# Patient Record
Sex: Female | Born: 2000 | Race: White | Hispanic: No | Marital: Single | Smoking: Never smoker
Health system: Southern US, Community
[De-identification: ages and names within clinical notes are randomized; demographics above are authoritative.]

---
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
## 2019-10-08 DIAGNOSIS — E669 Obesity, unspecified: Secondary | ICD-10-CM | POA: Insufficient documentation

## 2019-10-08 DIAGNOSIS — R519 Headache, unspecified: Secondary | ICD-10-CM | POA: Insufficient documentation

## 2019-10-08 DIAGNOSIS — G8929 Other chronic pain: Secondary | ICD-10-CM | POA: Insufficient documentation

## 2021-08-16 ENCOUNTER — Ambulatory Visit (HOSPITAL_COMMUNITY)
Admission: EM | Admit: 2021-08-16 | Discharge: 2021-08-16 | Disposition: A | Discharge status: Home / Self Care | Payer: BC Managed Care – PPO | Attending: Physician Assistant | Admitting: Physician Assistant

## 2021-08-16 ENCOUNTER — Ambulatory Visit (INDEPENDENT_AMBULATORY_CARE_PROVIDER_SITE_OTHER): Payer: BC Managed Care – PPO

## 2021-08-16 ENCOUNTER — Other Ambulatory Visit: Payer: Self-pay

## 2021-08-16 ENCOUNTER — Encounter (HOSPITAL_COMMUNITY): Payer: Self-pay

## 2021-08-16 DIAGNOSIS — S82391A Other fracture of lower end of right tibia, initial encounter for closed fracture: Secondary | ICD-10-CM

## 2021-08-16 DIAGNOSIS — M25572 Pain in left ankle and joints of left foot: Secondary | ICD-10-CM | POA: Diagnosis not present

## 2021-08-16 DIAGNOSIS — W19XXXA Unspecified fall, initial encounter: Secondary | ICD-10-CM

## 2021-08-16 DIAGNOSIS — M25571 Pain in right ankle and joints of right foot: Secondary | ICD-10-CM

## 2021-08-16 NOTE — ED Notes (Signed)
Ortho called for short leg splint.

## 2021-08-16 NOTE — ED Provider Notes (Signed)
[place]    CSN: [id number] Arrival date & time: 08/16/21  1605      History   Chief Complaint Chief Complaint  Patient presents with   Ankle Pain    HPI Jessica Phillips is a 20 y.o. female.   Patient presents today with a several hour history of bilateral ankle pain following injury at school.  Patient reports that she was stepping down off of the bottom step onto uneven ground when she lost her footing and her left ankle inverted and her right ankle everted.  She has had pain since that time which is rated 7.5 on a 0-10 pain scale, described as aching, worse with attempted ambulation, described as aching with shooting pains during palpation or ambulation, no alleviating factors identified.  She has not tried any over-the-counter medication for symptom management.  She denies history of previous ankle injury or surgery.  She does report some pain in her ankles while she is at work and standing on her feet for long periods of time but has not been evaluated for this.  She denies any numbness or paresthesias in her toes.  She is able to bear weight but it is uncomfortable.  She is confident she is not pregnant.   History reviewed. No pertinent past medical history.  There are no problems to display for this patient.   History reviewed. No pertinent surgical history.  OB History   No obstetric history on file.      Home Medications    Prior to Admission medications   Not on File    Family History History reviewed. No pertinent family history.  Social History Social History   Tobacco Use   Smoking status: Never   Smokeless tobacco: Never  Vaping Use   Vaping Use: Never used  Substance Use Topics   Alcohol use: Yes   Drug use: Never     Allergies   Patient has no known allergies.   Review of Systems Review of Systems  Constitutional:  Positive for activity change. Negative for appetite change, fatigue and fever.  Respiratory:  Negative for cough  and shortness of breath.   Cardiovascular:  Negative for chest pain.  Gastrointestinal:  Negative for abdominal pain, diarrhea, nausea and vomiting.  Musculoskeletal:  Positive for arthralgias. Negative for myalgias.  Neurological:  Negative for dizziness, light-headedness and headaches.    Physical Exam Triage Vital Signs ED Triage Vitals  Enc Vitals Group     BP 08/16/21 1700 137/75     Pulse Rate 08/16/21 1700 90     Resp 08/16/21 1700 18     Temp 08/16/21 1700 98.5 F (36.9 C)     Temp Source 08/16/21 1700 Oral     SpO2 08/16/21 1700 99 %     Weight --      Height --      Head Circumference --      Peak Flow --      Pain Score 08/16/21 1658 7     Pain Loc --      Pain Edu? --      Excl. in [place]? --    No data found.  Updated Vital Signs BP 137/75 (BP Location: Right Arm)   Pulse 90   Temp 98.5 F (36.9 C) (Oral)   Resp 18   LMP 08/08/2021 (Approximate)   SpO2 99%   Visual Acuity Right Eye Distance:   Left Eye Distance:   Bilateral Distance:    Right Eye Near:  Left Eye Near:    Bilateral Near:     Physical Exam Vitals reviewed.  Constitutional:      General: She is awake. She is not in acute distress.    Appearance: Normal appearance. She is normal weight. She is not ill-appearing.     Comments: Very pleasant female present age in no acute distress sitting comfortably in exam room  HENT:     Head: Normocephalic and atraumatic.  Cardiovascular:     Rate and Rhythm: Normal rate and regular rhythm.     Heart sounds: Normal heart sounds, S1 normal and S2 normal. No murmur heard. Pulmonary:     Effort: Pulmonary effort is normal.     Breath sounds: Normal breath sounds. No wheezing, rhonchi or rales.     Comments: Clear to auscultation bilaterally Abdominal:     General: Bowel sounds are normal.     Palpations: Abdomen is soft.     Tenderness: There is no abdominal tenderness. There is no right CVA tenderness, left CVA tenderness, guarding or rebound.   Musculoskeletal:     Right ankle: No swelling. Tenderness present over the lateral malleolus. Decreased range of motion. Anterior drawer test negative.     Left ankle: No swelling. Tenderness present over the medial malleolus. Decreased range of motion. Anterior drawer test negative.  Psychiatric:        Behavior: Behavior is cooperative.     UC Treatments / Results  Labs (all labs ordered are listed, but only abnormal results are displayed) Labs Reviewed - No data to display  EKG   Radiology DG Ankle Complete Left  Result Date: 08/16/2021 CLINICAL DATA:  Pain after fall EXAM: RIGHT ANKLE - COMPLETE 3+ VIEW; LEFT ANKLE COMPLETE - 3+ VIEW COMPARISON:  None. FINDINGS: Right ankle: Frontal, oblique, and lateral views are obtained. On the lateral view, there is a small lucency through the superior aspect of the posterior malleolus, felt to represent a vascular channel rather than fracture. Please correlate with site of patient's pain. No other acute bony abnormalities. Alignment is anatomic. Joint spaces are well preserved. Soft tissues are unremarkable. Left ankle: Frontal, oblique, and lateral views of the left ankle are obtained. No acute fracture, subluxation, or dislocation. Joint spaces are well preserved. Soft tissues are unremarkable. IMPRESSION: 1. Cortical lucency within the right posterior malleolus, felt to represent a vascular channel rather than fracture. Please correlate with physical exam findings. 2. Otherwise no acute bony abnormality within either ankle. Electronically Signed   By: Randa Ngo M.D.   On: 08/16/2021 17:40   DG Ankle Complete Right  Result Date: 08/16/2021 CLINICAL DATA:  Pain after fall EXAM: RIGHT ANKLE - COMPLETE 3+ VIEW; LEFT ANKLE COMPLETE - 3+ VIEW COMPARISON:  None. FINDINGS: Right ankle: Frontal, oblique, and lateral views are obtained. On the lateral view, there is a small lucency through the superior aspect of the posterior malleolus, felt to  represent a vascular channel rather than fracture. Please correlate with site of patient's pain. No other acute bony abnormalities. Alignment is anatomic. Joint spaces are well preserved. Soft tissues are unremarkable. Left ankle: Frontal, oblique, and lateral views of the left ankle are obtained. No acute fracture, subluxation, or dislocation. Joint spaces are well preserved. Soft tissues are unremarkable. IMPRESSION: 1. Cortical lucency within the right posterior malleolus, felt to represent a vascular channel rather than fracture. Please correlate with physical exam findings. 2. Otherwise no acute bony abnormality within either ankle. Electronically Signed   By: Randa Ngo  M.D.   On: 08/16/2021 17:40    Procedures Procedures (including critical care time)  Medications Ordered in UC Medications - No data to display  Initial Impression / Assessment and Plan / UC Course  I have reviewed the triage vital signs and the nursing notes.  Pertinent labs & imaging results that were available during my care of the patient were reviewed by me and considered in my medical decision making (see chart for details).      X-ray obtained showed possible posterior malleolus fracture thought to be vascular channel, however, patient is tender over this area so we will treat as fracture and have her follow-up with foot and ankle as soon as possible.  She was given contact information for local specialist and instructed to call them to schedule an appointment soon as possible.  She was placed in posterior splint and instructed to be nonweightbearing until seeing the specialist.  She was given crutches.  Encouraged to use conservative treatment measures including RICE protocol for additional symptom relief.  She can alternate over-the-counter medications for pain relief.  She was given school excuse note allowing her several days off while managing this injury.  Discussed alarm symptoms that warrant emergent  evaluation.  Strict return precautions given to which patient expressed understanding.  Final Clinical Impressions(s) / UC Diagnoses   Final diagnoses:  Acute right ankle pain  Acute left ankle pain  Closed fracture of posterior malleolus of right tibia, initial encounter     Discharge Instructions      Please continue using cam boot until you are seen by specialist.  Keep your foot elevated as much as possible and use ice for additional symptom relief.  You can alternate Tylenol and ibuprofen over-the-counter for symptom relief.  Please call and schedule an appointment with podiatry/ankle specialist as soon as possible to be evaluated.  If you have any worsening symptoms including increased pain, swelling, numbness or tingling in your feet, inability to bear weight you need to be seen immediately.     ED Prescriptions   None    PDMP not reviewed this encounter.   Terrilee Croak, PA-C 08/16/21 1807

## 2021-08-16 NOTE — Discharge Instructions (Signed)
Please continue using cam boot until you are seen by specialist.  Keep your foot elevated as much as possible and use ice for additional symptom relief.  You can alternate Tylenol and ibuprofen over-the-counter for symptom relief.  Please call and schedule an appointment with podiatry/ankle specialist as soon as possible to be evaluated.  If you have any worsening symptoms including increased pain, swelling, numbness or tingling in your feet, inability to bear weight you need to be seen immediately.

## 2021-08-16 NOTE — ED Triage Notes (Signed)
Pt in with bilateral ankle pain after she fell down stairs at school  States the right ankle hurts more than the left and it hurts to put pressure on it

## 2021-08-17 ENCOUNTER — Encounter: Payer: Self-pay | Admitting: Podiatry

## 2021-08-17 ENCOUNTER — Ambulatory Visit: Payer: BC Managed Care – PPO | Admitting: Podiatry

## 2021-08-17 DIAGNOSIS — S93402A Sprain of unspecified ligament of left ankle, initial encounter: Secondary | ICD-10-CM

## 2021-08-17 DIAGNOSIS — S82391A Other fracture of lower end of right tibia, initial encounter for closed fracture: Secondary | ICD-10-CM

## 2021-08-18 ENCOUNTER — Encounter: Payer: Self-pay | Admitting: Podiatry

## 2021-08-18 NOTE — Progress Notes (Signed)
Subjective:  Patient ID: Jessica Phillips, female    DOB: 2001/07/20,  MRN: [id number]  Chief Complaint  Patient presents with   Fracture    Right foot fracture     20 y.o. female presents with the above complaint.  Patient presents with complaint of right posterior tibial lip fracture and left ankle sprain.  Patient states that it happened yesterday on 08/16/2021.  She states that she fell while walking at the school and it hurt a lot.  She says the right side is worse than left side.  She went to urgent care who had x-rays done which showed the fracture.  She states that she is ambulating with crutches with nonweightbearing to the right side and a brace on the left side.  She denies any other acute complaint she has not seen any foot and ankle specialist prior to me.  She would like to discuss treatment options.   Review of Systems: Negative except as noted in the HPI. Denies N/V/F/Ch.  No past medical history on file.  Current Outpatient Medications:    norgestimate-ethinyl estradiol (ORTHO-CYCLEN) 0.25-35 MG-MCG tablet, 1 po Q day, Disp: , Rfl:    norgestimate-ethinyl estradiol (SPRINTEC 28) 0.25-35 MG-MCG tablet, Take 1 tablet by mouth daily., Disp: , Rfl:    meclizine (ANTIVERT) 25 MG tablet, Take 25 mg by mouth 3 (three) times daily as needed., Disp: , Rfl:   Social History   Tobacco Use  Smoking Status Never  Smokeless Tobacco Never    No Known Allergies Objective:  There were no vitals filed for this visit. There is no height or weight on file to calculate BMI. Constitutional Well developed. Well nourished.  Vascular Dorsalis pedis pulses palpable bilaterally. Posterior tibial pulses palpable bilaterally. Capillary refill normal to all digits.  No cyanosis or clubbing noted. Pedal hair growth normal.  Neurologic Normal speech. Oriented to person, place, and time. Epicritic sensation to light touch grossly present bilaterally.  Dermatologic Nails well groomed and normal  in appearance. No open wounds. No skin lesions.  Orthopedic: Pain on palpation to the deep posterior ankle joint.  Pain with range of motion of the ankle joint on the right side.  No ecchymosis noted on the right side.  No pain at the medial and lateral malleolus.  No pain at the anterior ankle joint.  No pain of the posterior tibial Achilles tendon peroneal tendon ATFL ligament.  Pain on palpation to the ATFL ligament on the left side.  Pain with plantarflexion inversion of the foot.  Pain with dorsiflexion eversion of the foot resisted.  Negative anterior drawer test or talar tilt test.   Radiographs: 1. Cortical lucency within the right posterior malleolus, felt to represent a vascular channel rather than fracture. Please correlate with physical exam findings. 2. Otherwise no acute bony abnormality within either ankle.    Assessment:   1. Closed fracture of posterior malleolus of right tibia, initial encounter   2. Moderate left ankle sprain, initial encounter    Plan:  Patient was evaluated and treated and all questions answered.  Right posterior malleolus lip fracture very small nondisplaced -I explained to the patient the etiology of fracture and various treatment options were extensively discussed.  Given that this is very minimally displaced fracture with clinically correlating the pain I believe patient would benefit from cam boot immobilization.  She does not need to be nonweightbearing.  She can be weightbearing as tolerated in a cam boot.  I discussed with the patient will take  about 6 to 8 weeks to completely heal.  She states understanding. -Cam boot was dispensed  Left ankle sprain moderate -I explained the patient the etiology of ankle sprain and various treatment options were discussed.  Given that she will be in a boot on the right side she will benefit from continued use of the brace that she received from urgent care.  I encouraged her to wear her brace while  ambulating.  She states understand we will do so. -If there is no improvement we can always move the boot over to the left leg after the right side has healed.  No follow-ups on file.

## 2021-09-28 ENCOUNTER — Ambulatory Visit: Payer: BC Managed Care – PPO | Admitting: Podiatry

## 2021-09-28 ENCOUNTER — Other Ambulatory Visit: Payer: Self-pay

## 2021-09-28 DIAGNOSIS — S82391A Other fracture of lower end of right tibia, initial encounter for closed fracture: Secondary | ICD-10-CM

## 2021-09-28 DIAGNOSIS — S93402A Sprain of unspecified ligament of left ankle, initial encounter: Secondary | ICD-10-CM | POA: Diagnosis not present

## 2021-10-06 NOTE — Progress Notes (Signed)
  Subjective:  Patient ID: Jessica Phillips, female    DOB: April 16, 2001,  MRN: [id number]  Chief Complaint  Patient presents with   Follow-up    6 week right foot fracture follow up. Pt states she is doing well and is able to walk around a lot.    20 y.o. female presents with the above complaint.  Patient presents with complaint of right posterior lip fracture as well as left ankle sprain.  Patient states she is doing a lot better no acute complaints has been able to do activities without restrictions.  She has been able to walk around a lot.  She denies any other acute complaints.  Review of Systems: Negative except as noted in the HPI. Denies N/V/F/Ch.  No past medical history on file.  Current Outpatient Medications:    meclizine (ANTIVERT) 25 MG tablet, Take 25 mg by mouth 3 (three) times daily as needed., Disp: , Rfl:    norgestimate-ethinyl estradiol (ORTHO-CYCLEN) 0.25-35 MG-MCG tablet, 1 po Q day, Disp: , Rfl:    norgestimate-ethinyl estradiol (SPRINTEC 28) 0.25-35 MG-MCG tablet, Take 1 tablet by mouth daily., Disp: , Rfl:   Social History   Tobacco Use  Smoking Status Never  Smokeless Tobacco Never    No Known Allergies Objective:  There were no vitals filed for this visit. There is no height or weight on file to calculate BMI. Constitutional Well developed. Well nourished.  Vascular Dorsalis pedis pulses palpable bilaterally. Posterior tibial pulses palpable bilaterally. Capillary refill normal to all digits.  No cyanosis or clubbing noted. Pedal hair growth normal.  Neurologic Normal speech. Oriented to person, place, and time. Epicritic sensation to light touch grossly present bilaterally.  Dermatologic Nails well groomed and normal in appearance. No open wounds. No skin lesions.  Orthopedic: No further pain on palpation to the deep posterior ankle joint.  No further pain with range of motion of the ankle joint on the right side.  No ecchymosis noted on the right  side.  No pain at the medial and lateral malleolus.  No pain at the anterior ankle joint.  No pain of the posterior tibial Achilles tendon peroneal tendon ATFL ligament.  No further pain on palpation to the ATFL ligament on the left side.  Pain with plantarflexion inversion of the foot.  Pain with dorsiflexion eversion of the foot resisted.  Negative anterior drawer test or talar tilt test.   Radiographs: 1. Cortical lucency within the right posterior malleolus, felt to represent a vascular channel rather than fracture. Please correlate with physical exam findings. 2. Otherwise no acute bony abnormality within either ankle.    Assessment:   1. Closed fracture of posterior malleolus of right tibia, initial encounter   2. Moderate left ankle sprain, initial encounter     Plan:  Patient was evaluated and treated and all questions answered.  Right posterior malleolus lip fracture very small nondisplaced -Clinically healed and can transition to regular shoes.  At this time patient does not have any pain with range of motion of the ankle.  I discussed with the patient if any foot and ankle issues arise in future of asked her to come see me.  She states understanding  Left ankle sprain moderate -Clinically healed and has returned to regular shoes with shoe gear modification.  No acute complaints.  No follow-ups on file.

## 2022-10-27 IMAGING — DX DG ANKLE COMPLETE 3+V*L*
3 series · 3 of 3 positions shown · non-contrast
Comparison: None.

CLINICAL DATA: Pain after fall

EXAM:
RIGHT ANKLE - COMPLETE 3+ VIEW; LEFT ANKLE COMPLETE - 3+ VIEW

[ankle ap]
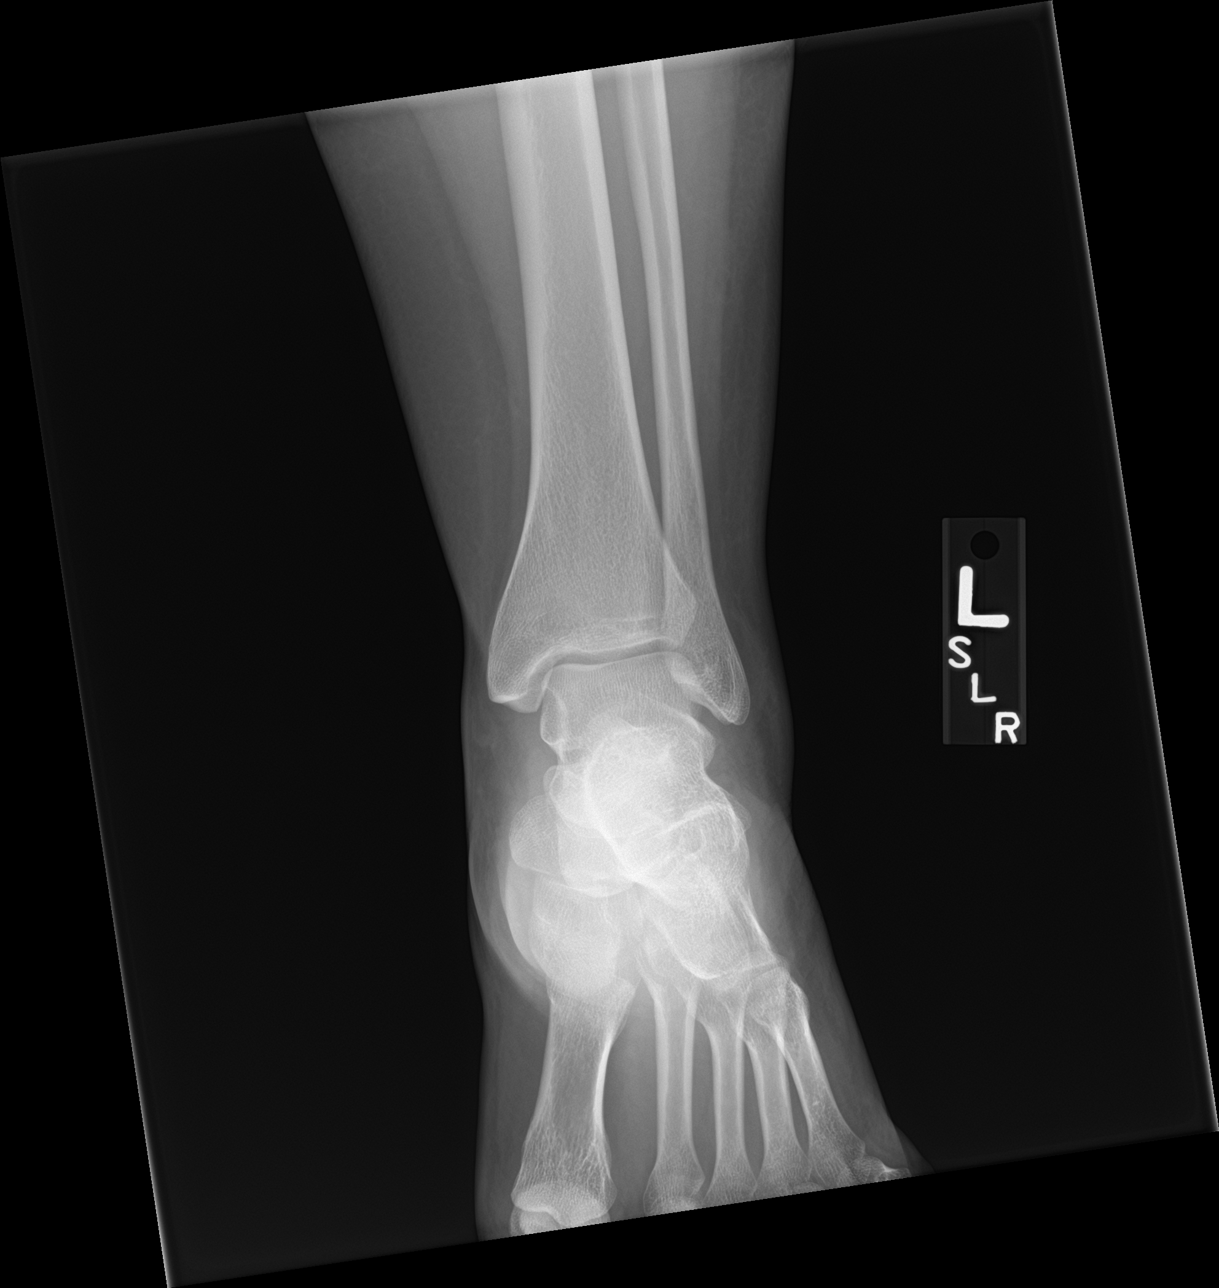

[ankle obl]
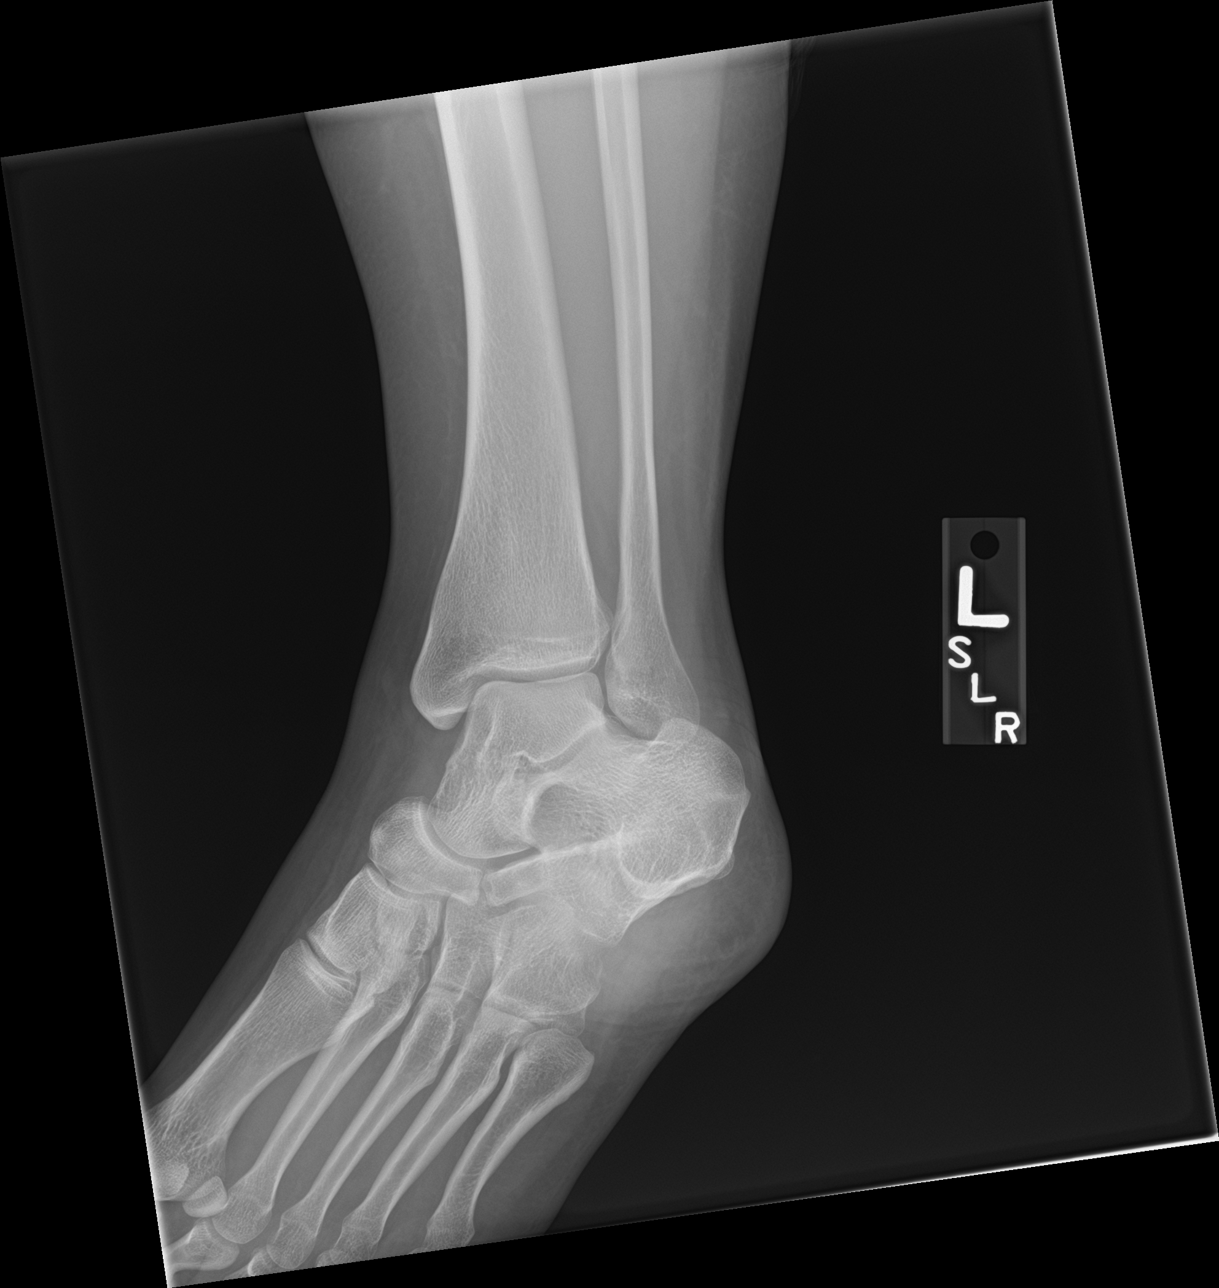

[ankle lat]
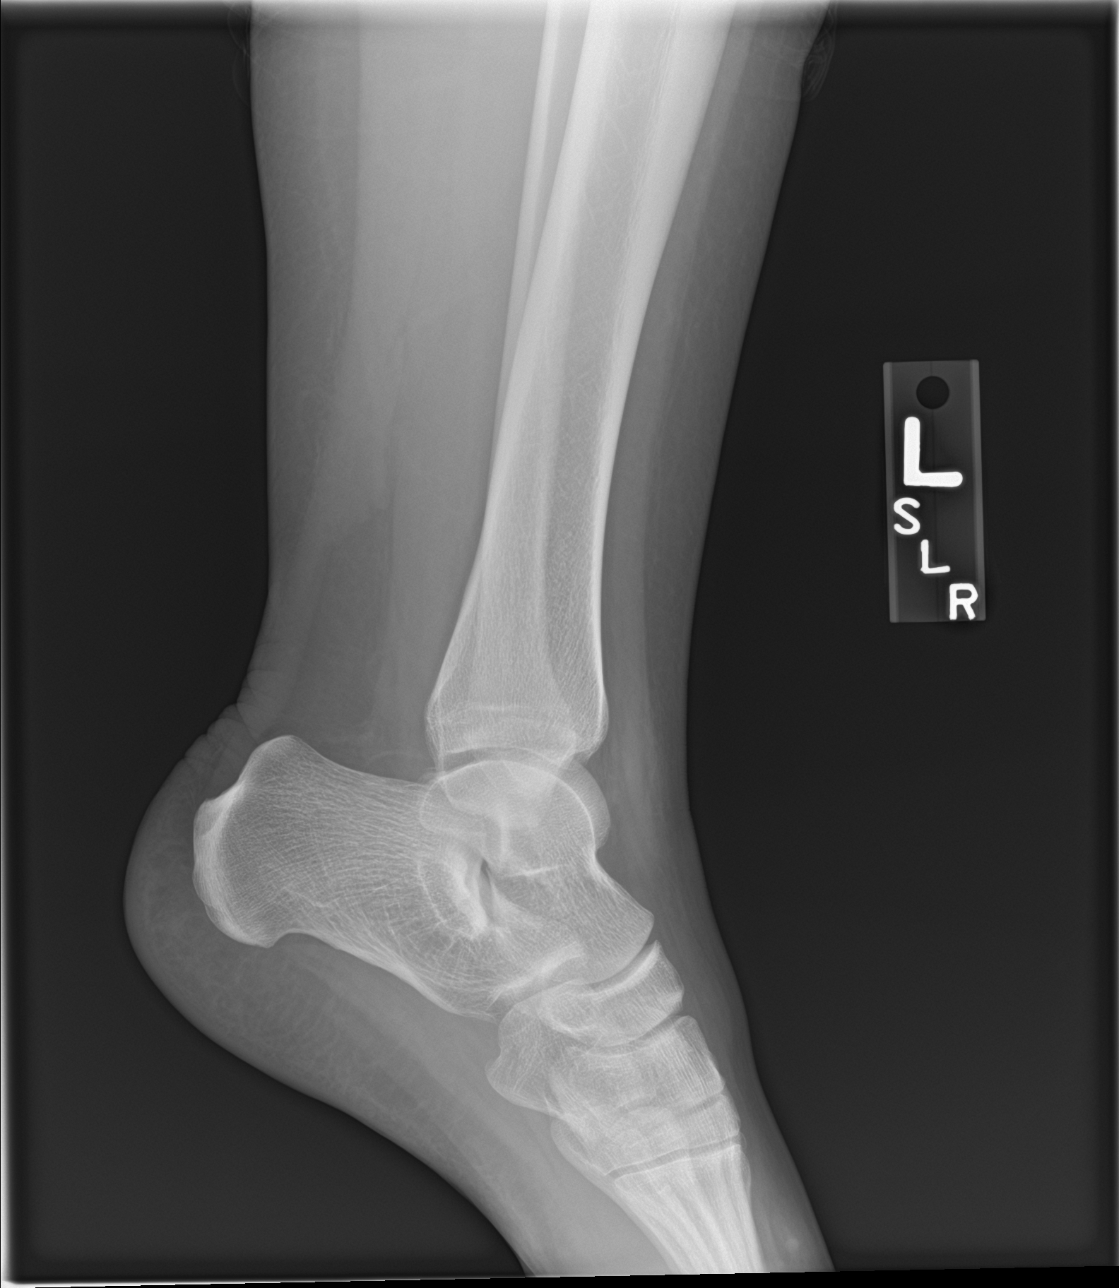

[3 of 3 positions shown; findings below may reference images not displayed]

FINDINGS: Right ankle: Frontal, oblique, and lateral views are obtained. On
the lateral view, there is a small lucency through the superior
aspect of the posterior malleolus, felt to represent a vascular
channel rather than fracture. Please correlate with site of
patient's pain. No other acute bony abnormalities. Alignment is
anatomic. Joint spaces are well preserved. Soft tissues are
unremarkable.

Left ankle: Frontal, oblique, and lateral views of the left ankle
are obtained. No acute fracture, subluxation, or dislocation. Joint
spaces are well preserved. Soft tissues are unremarkable.
IMPRESSION: 1. Cortical lucency within the right posterior malleolus, felt to
represent a vascular channel rather than fracture. Please correlate
with physical exam findings.
2. Otherwise no acute bony abnormality within either ankle.
